# Patient Record
Sex: Male | Born: 1995 | Race: White | Hispanic: No | Marital: Single | Smoking: Never smoker
Health system: Southern US, Community
[De-identification: ages and names within clinical notes are randomized; demographics above are authoritative.]

## PROBLEM LIST (undated history)

## (undated) DIAGNOSIS — F419 Anxiety disorder, unspecified: Secondary | ICD-10-CM

## (undated) HISTORY — PX: WISDOM TOOTH EXTRACTION: SHX21

## (undated) HISTORY — PX: HERNIA REPAIR: SHX51

---
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
## 2017-05-11 ENCOUNTER — Ambulatory Visit (INDEPENDENT_AMBULATORY_CARE_PROVIDER_SITE_OTHER): Payer: Self-pay

## 2017-05-11 ENCOUNTER — Other Ambulatory Visit: Payer: Self-pay | Admitting: Emergency Medicine

## 2017-05-11 DIAGNOSIS — Z021 Encounter for pre-employment examination: Secondary | ICD-10-CM

## 2018-05-21 ENCOUNTER — Other Ambulatory Visit: Payer: Self-pay | Admitting: Gerontology

## 2018-05-21 ENCOUNTER — Ambulatory Visit (INDEPENDENT_AMBULATORY_CARE_PROVIDER_SITE_OTHER): Payer: Self-pay

## 2018-05-21 DIAGNOSIS — Z0000 Encounter for general adult medical examination without abnormal findings: Secondary | ICD-10-CM

## 2019-08-26 IMAGING — DX DG CHEST 1V
1 series · 1 of 1 positions shown · non-contrast
Comparison: Radiograph May 11, 2017.

CLINICAL DATA: Annual physical exam.

EXAM:
CHEST  1 VIEW

[chest pa]
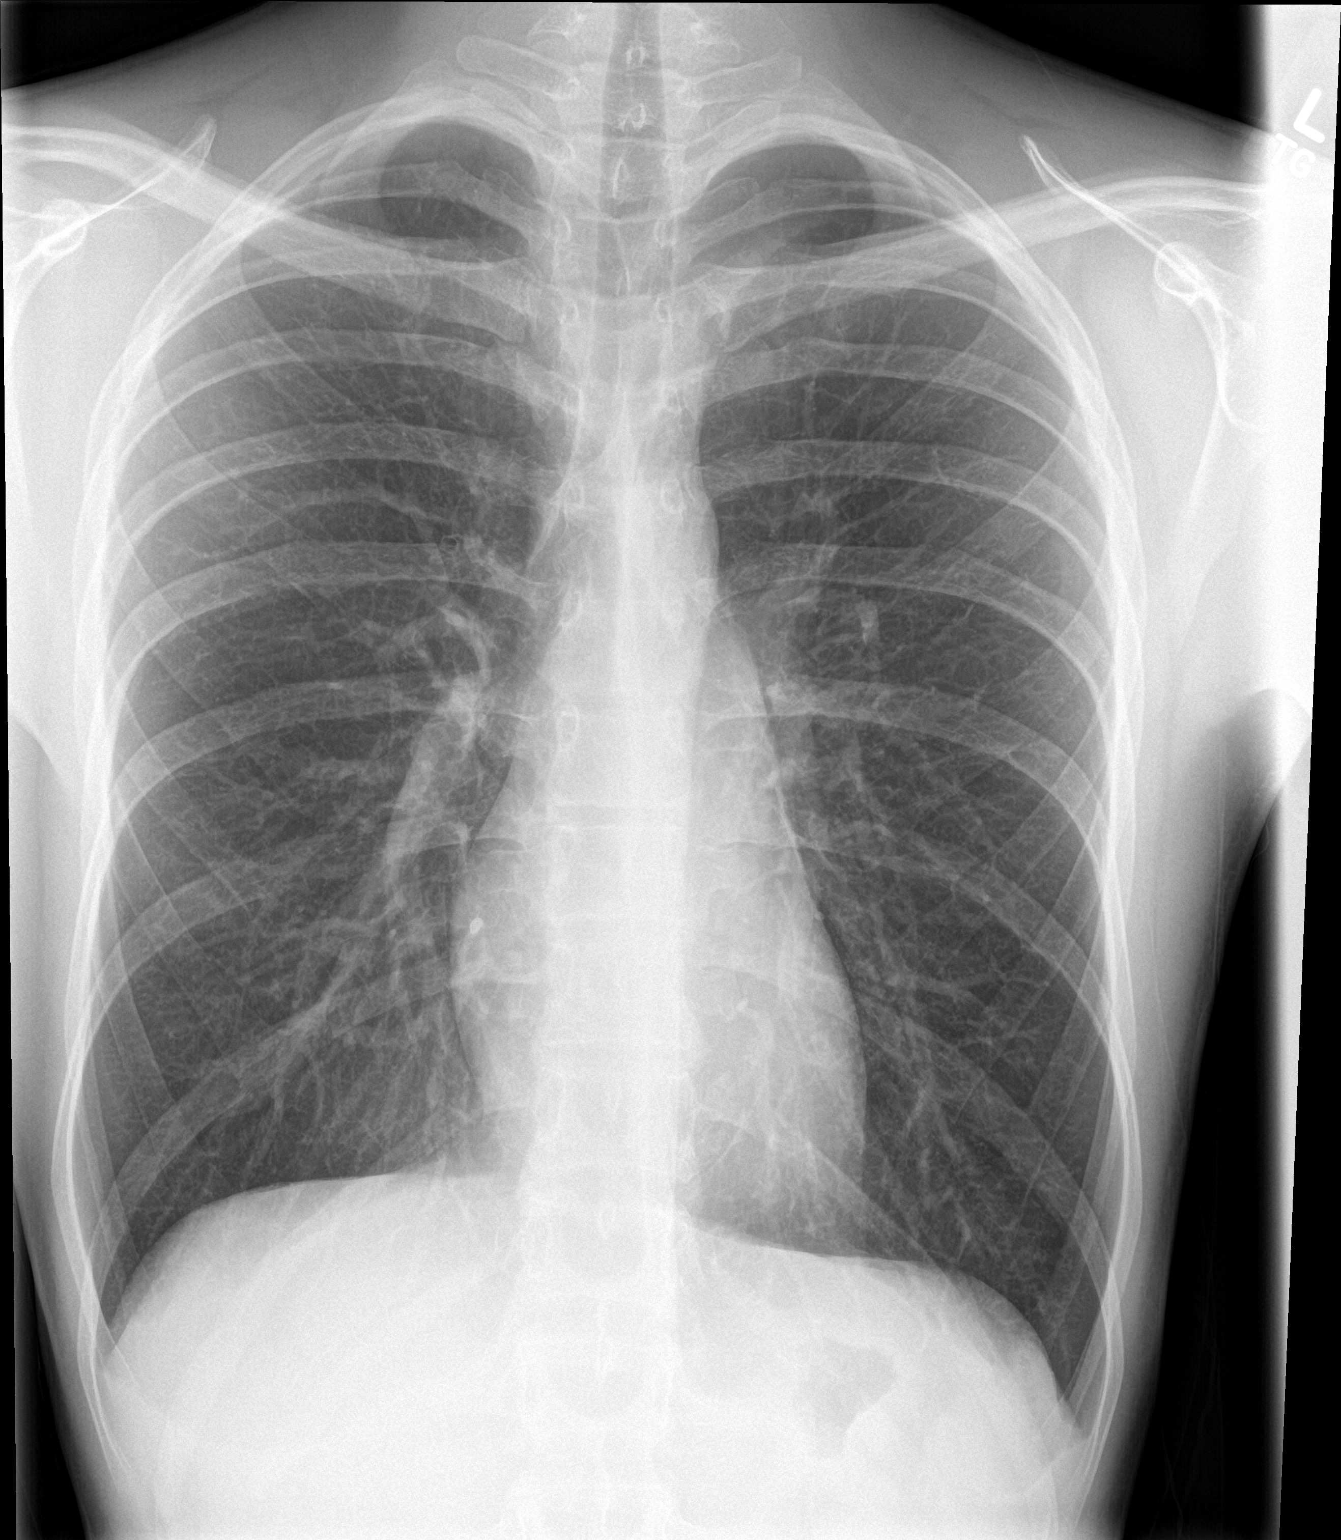

[1 of 1 positions shown; findings below may reference images not displayed]

FINDINGS: The heart size and mediastinal contours are within normal limits.
Both lungs are clear. The visualized skeletal structures are
unremarkable.
IMPRESSION: No active disease.

## 2021-12-14 ENCOUNTER — Other Ambulatory Visit: Payer: Self-pay

## 2021-12-14 ENCOUNTER — Ambulatory Visit
Admission: RE | Admit: 2021-12-14 | Discharge: 2021-12-14 | Disposition: A | Discharge status: Home / Self Care | Payer: BC Managed Care – PPO | Source: Ambulatory Visit | Attending: Emergency Medicine | Admitting: Emergency Medicine

## 2021-12-14 VITALS — BP 125/77 | HR 76 | Temp 97.9°F | Resp 14

## 2021-12-14 DIAGNOSIS — R110 Nausea: Secondary | ICD-10-CM

## 2021-12-14 MED ORDER — AMITRIPTYLINE HCL 10 MG PO TABS: 10.0000 mg | ORAL_TABLET | Freq: Every day | ORAL | 2 refills | Status: AC

## 2021-12-14 NOTE — Discharge Instructions (Addendum)
I have initiated request to assist you in finding a primary care provider.  I have also provided you with contact information for Gastroenterology in [place].  Please reach out to them today to see if you are able to schedule an appointment.  If you are not, please wait to see your new primary care provider and request a referral at that time. ? ?While you are waiting to get these appointments set up, I recommend that you try a medication called amitriptyline 10 mg, 1 tablet at bedtime.  This medication is in a class of medications called tricyclic antidepressants.  This particular version has been known to address dyspepsia, pain, anxiety and headache.  I think it is worth a try for a week or 2.  After week, if you notice that your symptoms have improved slightly but not quite as much as you like, you can certainly double or triple this medication until you find a dose that works well for you. ? ?Thank you for visiting urgent care today. ?

## 2021-12-14 NOTE — ED Triage Notes (Signed)
Pt reports that had issues with nausea all his life. Reports had issues with nausea espt after eating and drinking. Reports that stools have been very sticky and having to strain more to have BM.  Been taking zofran but not helping with nausea.  ?Doesn't have a PCP and wanting a referral to GI.  ?

## 2021-12-14 NOTE — ED Provider Notes (Signed)
[facility]    CSN: [id number] Arrival date & time: 12/14/21  1257    HISTORY   Chief Complaint  Patient presents with   appt 1p   Nausea   HPI Donald Pierce is a 26 y.o. male. Pt reports that had issues with nausea all his life. Reports had issues with nausea espt after eating and drinking. Reports that stools have been very sticky and having to strain more to have BM.  Been taking zofran but not helping with nausea.  Patient states that he does not have a PCP and is asking for a referral to GI.  Patient denies history of GERD, constipation.  Patient states that occasionally does have diarrhea.  Patient is here with his girlfriend today who states that patient sometimes gets "worked up" about being nauseated, almost as if he is anticipating it all the time.  The history is provided by the patient.  History reviewed. No pertinent past medical history. There are no problems to display for this patient.  Past Surgical History:  Procedure Laterality Date   HERNIA REPAIR     WISDOM TOOTH EXTRACTION      Home Medications    Prior to Admission medications   Medication Sig Start Date End Date Taking? Authorizing Provider  Cetirizine HCl (ZYRTEC ALLERGY) 10 MG CAPS 1 tab PO daily 06/27/15 08/10/15  Glynis Smiles, DO  diphenhydrAMINE (SOMINEX) 25 MG tablet Take 25 mg by mouth at bedtime as needed (symptoms).    [provider]  ibuprofen (ADVIL,MOTRIN) 200 MG tablet Take 200 mg by mouth every 6 (six) hours as needed for fever.    [provider]   Family History No family history on file. Social History Social History   Tobacco Use   Smoking status: Never   Smokeless tobacco: Never  Vaping Use   Vaping Use: Never used  Substance Use Topics   Alcohol use: Not Currently   Allergies   Cephalosporins and Penicillins  Review of Systems Review of Systems Pertinent findings noted in history of present illness.   Physical Exam Triage Vital Signs ED  Triage Vitals  Enc Vitals Group     BP 08/06/21 0827 (!) 147/82     Pulse Rate 08/06/21 0827 72     Resp 08/06/21 0827 18     Temp 08/06/21 0827 98.3 F (36.8 C)     Temp Source 08/06/21 0827 Oral     SpO2 08/06/21 0827 98 %     Weight --      Height --      Head Circumference --      Peak Flow --      Pain Score 08/06/21 0826 5     Pain Loc --      Pain Edu? --      Excl. in [place]? --   No data found.  Updated Vital Signs BP 125/77 (BP Location: Left Arm)    Pulse 76    Temp 97.9 F (36.6 C) (Oral)    Resp 14    SpO2 97%   Physical Exam Vitals and nursing note reviewed.  Constitutional:      General: He is not in acute distress.    Appearance: Normal appearance. He is not ill-appearing.  HENT:     Head: Normocephalic and atraumatic.  Eyes:     General: Lids are normal.        Right eye: No discharge.        Left eye: No discharge.  Extraocular Movements: Extraocular movements intact.     Conjunctiva/sclera: Conjunctivae normal.     Right eye: Right conjunctiva is not injected.     Left eye: Left conjunctiva is not injected.  Neck:     Trachea: Trachea and phonation normal.  Cardiovascular:     Rate and Rhythm: Normal rate and regular rhythm.     Pulses: Normal pulses.     Heart sounds: Normal heart sounds. No murmur heard.   No friction rub. No gallop.  Pulmonary:     Effort: Pulmonary effort is normal. No accessory muscle usage, prolonged expiration or respiratory distress.     Breath sounds: Normal breath sounds. No stridor, decreased air movement or transmitted upper airway sounds. No decreased breath sounds, wheezing, rhonchi or rales.  Chest:     Chest wall: No tenderness.  Abdominal:     General: Abdomen is flat. Bowel sounds are normal.     Palpations: Abdomen is soft.  Musculoskeletal:        General: Normal range of motion.     Cervical back: Normal range of motion and neck supple. Normal range of motion.  Lymphadenopathy:     Cervical: No cervical  adenopathy.  Skin:    General: Skin is warm and dry.     Findings: No erythema or rash.  Neurological:     General: No focal deficit present.     Mental Status: He is alert and oriented to person, place, and time.  Psychiatric:        Mood and Affect: Mood normal.        Behavior: Behavior normal.    Visual Acuity Right Eye Distance:   Left Eye Distance:   Bilateral Distance:    Right Eye Near:   Left Eye Near:    Bilateral Near:     UC Couse / Diagnostics / Procedures:    EKG  Radiology No results found.  Procedures Procedures (including critical care time)  UC Diagnoses / Final Clinical Impressions(s)   I have reviewed the triage vital signs and the nursing notes.  Pertinent labs & imaging results that were available during my care of the patient were reviewed by me and considered in my medical decision making (see chart for details).   Final diagnoses:  Daily nausea   Suspect there is a slight psychomotor component to patient's ongoing nausea but do agree that patient does need GI work-up.  I advised patient that I recommend he try Elavil while he is waiting for GI appointment.  Patient agreeable.  All questions addressed, return precautions advised.  ED Prescriptions     Medication Sig Dispense Auth. Provider   amitriptyline (ELAVIL) 10 MG tablet Take 1 tablet (10 mg total) by mouth at bedtime. 30 tablet Lynden Oxford Scales, PA-C      PDMP not reviewed this encounter.  Pending results:  Labs Reviewed - No data to display  Medications Ordered in UC: Medications - No data to display  Disposition Upon Discharge:  Condition: stable for discharge home Home: take medications as prescribed; routine discharge instructions as discussed; follow up as advised.  Patient presented with an acute illness with associated systemic symptoms and significant discomfort requiring urgent management. In my opinion, this is a condition that a prudent lay person (someone who  possesses an average knowledge of health and medicine) may potentially expect to result in complications if not addressed urgently such as respiratory distress, impairment of bodily function or dysfunction of bodily organs.   Routine  symptom specific, illness specific and/or disease specific instructions were discussed with the patient and/or caregiver at length.   As such, the patient has been evaluated and assessed, work-up was performed and treatment was provided in alignment with urgent care protocols and evidence based medicine.  Patient/parent/caregiver has been advised that the patient may require follow up for further testing and treatment if the symptoms continue in spite of treatment, as clinically indicated and appropriate.  If the patient was tested for COVID-19, Influenza and/or RSV, then the patient/parent/guardian was advised to isolate at home pending the results of his/her diagnostic coronavirus test and potentially longer if theyre positive. I have also advised pt that if his/her COVID-19 test returns positive, it's recommended to self-isolate for at least 10 days after symptoms first appeared AND until fever-free for 24 hours without fever reducer AND other symptoms have improved or resolved. Discussed self-isolation recommendations as well as instructions for household member/close contacts as per the [facility] and [place] [facility], and also gave patient the [facility] packet with this information.  Patient/parent/caregiver has been advised to return to the [facility] or PCP in 3-5 days if no better; to PCP or the Emergency Department if new signs and symptoms develop, or if the current signs or symptoms continue to change or worsen for further workup, evaluation and treatment as clinically indicated and appropriate  The patient will follow up with their current PCP if and as advised. If the patient does not currently have a PCP we will assist them in obtaining one.   The patient may need specialty follow up  if the symptoms continue, in spite of conservative treatment and management, for further workup, evaluation, consultation and treatment as clinically indicated and appropriate.  Patient/parent/caregiver verbalized understanding and agreement of plan as discussed.  All questions were addressed during visit.  Please see discharge instructions below for further details of plan.  Discharge Instructions:   Discharge Instructions      I have initiated request to assist you in finding a primary care provider.  I have also provided you with contact information for Gastroenterology in [place].  Please reach out to them today to see if you are able to schedule an appointment.  If you are not, please wait to see your new primary care provider and request a referral at that time.  While you are waiting to get these appointments set up, I recommend that you try a medication called amitriptyline 10 mg, 1 tablet at bedtime.  This medication is in a class of medications called tricyclic antidepressants.  This particular version has been known to address dyspepsia, pain, anxiety and headache.  I think it is worth a try for a week or 2.  After week, if you notice that your symptoms have improved slightly but not quite as much as you like, you can certainly double or triple this medication until you find a dose that works well for you.  Thank you for visiting urgent care today.    This office note has been dictated using Museum/gallery curator.  Unfortunately, and despite my best efforts, this method of dictation can sometimes lead to occasional typographical or grammatical errors.  I apologize in advance if this occurs.     Lynden Oxford Scales, PA-C 12/14/21 1539

## 2021-12-19 ENCOUNTER — Encounter (HOSPITAL_BASED_OUTPATIENT_CLINIC_OR_DEPARTMENT_OTHER): Payer: Self-pay | Admitting: Emergency Medicine

## 2021-12-19 ENCOUNTER — Emergency Department (HOSPITAL_BASED_OUTPATIENT_CLINIC_OR_DEPARTMENT_OTHER): Payer: BC Managed Care – PPO

## 2021-12-19 ENCOUNTER — Emergency Department (HOSPITAL_BASED_OUTPATIENT_CLINIC_OR_DEPARTMENT_OTHER)
Admission: EM | Admit: 2021-12-19 | Discharge: 2021-12-19 | Disposition: A | Discharge status: Home / Self Care | Payer: BC Managed Care – PPO | Attending: Emergency Medicine | Admitting: Emergency Medicine

## 2021-12-19 ENCOUNTER — Other Ambulatory Visit: Payer: Self-pay

## 2021-12-19 DIAGNOSIS — R110 Nausea: Secondary | ICD-10-CM | POA: Insufficient documentation

## 2021-12-19 DIAGNOSIS — R0789 Other chest pain: Secondary | ICD-10-CM | POA: Insufficient documentation

## 2021-12-19 DIAGNOSIS — F419 Anxiety disorder, unspecified: Secondary | ICD-10-CM | POA: Insufficient documentation

## 2021-12-19 DIAGNOSIS — R079 Chest pain, unspecified: Secondary | ICD-10-CM | POA: Diagnosis present

## 2021-12-19 HISTORY — DX: Anxiety disorder, unspecified: F41.9

## 2021-12-19 LAB — COMPREHENSIVE METABOLIC PANEL
ALT: 10 U/L (ref 0–44)
AST: 16 U/L (ref 15–41)
Albumin: 4.9 g/dL (ref 3.5–5.0)
Alkaline Phosphatase: 64 U/L (ref 38–126)
Anion gap: 12 (ref 5–15)
BUN: 16 mg/dL (ref 6–20)
CO2: 24 mmol/L (ref 22–32)
Calcium: 9.9 mg/dL (ref 8.9–10.3)
Chloride: 102 mmol/L (ref 98–111)
Creatinine, Ser: 1.1 mg/dL (ref 0.61–1.24)
GFR, Estimated: >60 mL/min (ref >60)
Glucose, Bld: 95 mg/dL (ref 70–99)
Potassium: 4.2 mmol/L (ref 3.5–5.1)
Sodium: 138 mmol/L (ref 135–145)
Total Bilirubin: 1.2 mg/dL (ref 0.3–1.2)
Total Protein: 7.9 g/dL (ref 6.5–8.1)

## 2021-12-19 LAB — CBC WITH DIFFERENTIAL/PLATELET
Abs Immature Granulocytes: 0 10*3/uL (ref 0.00–0.07)
Basophils Absolute: 0 10*3/uL (ref 0.0–0.1)
Basophils Relative: 1 %
Eosinophils Absolute: 0.1 10*3/uL (ref 0.0–0.5)
Eosinophils Relative: 2 %
HCT: 48.2 % (ref 39.0–52.0)
Hemoglobin: 16.9 g/dL (ref 13.0–17.0)
Immature Granulocytes: 0 %
Lymphocytes Relative: 34 %
Lymphs Abs: 1.5 10*3/uL (ref 0.7–4.0)
MCH: 30.6 pg (ref 26.0–34.0)
MCHC: 35.1 g/dL (ref 30.0–36.0)
MCV: 87.2 fL (ref 80.0–100.0)
Monocytes Absolute: 0.4 10*3/uL (ref 0.1–1.0)
Monocytes Relative: 8 %
Neutro Abs: 2.4 10*3/uL (ref 1.7–7.7)
Neutrophils Relative %: 55 %
Platelets: 237 10*3/uL (ref 150–400)
RBC: 5.53 MIL/uL (ref 4.22–5.81)
RDW: 11.6 % (ref 11.5–15.5)
WBC: 4.5 10*3/uL (ref 4.0–10.5)
nRBC: 0 % (ref 0.0–0.2)

## 2021-12-19 LAB — TROPONIN I (HIGH SENSITIVITY): Troponin I (High Sensitivity): <2 ng/L (ref <18)

## 2021-12-19 LAB — LIPASE, BLOOD: Lipase: 12 U/L (ref 11–51)

## 2021-12-19 LAB — D-DIMER, QUANTITATIVE: D-Dimer, Quant: <0.27 ug/mL-FEU (ref 0.00–0.50)

## 2021-12-19 MED ORDER — ONDANSETRON HCL 4 MG/2ML IJ SOLN
4.0000 mg | Freq: Once | INTRAMUSCULAR | Status: AC
  Administered 2021-12-19: 4 mg via INTRAVENOUS
  Filled 2021-12-19: qty 2

## 2021-12-19 NOTE — ED Provider Notes (Signed)
?[facility] ?Provider Note ? ? ?CSN: [id number] ?Arrival date & time: 12/19/21  1039 ? ?  ? ?History ? ?Chief Complaint  ?Patient presents with  ? Nausea  ? ? ?Donald Pierce is a 26 y.o. male. ? ?Is here with ongoing nausea.  Some chest pain yesterday.  Has started amitriptyline for nausea disorder/anxiety.  Denies any suicidal homicidal ideation.  Denies any cough or sputum production.  Denies any abdominal pain.  Started having some chest pain with his nausea symptoms.  Denies any recent illness.  Denies any alcohol, drug use including marijuana.  No significant family history of cardiac disease or pulmonary embolism.  States he does have anxiety often associated with his nausea. ? ?The history is provided by the patient.  ?Illness ?Severity:  Mild ?Onset quality:  Gradual ?Duration:  2 days ?Timing:  Intermittent ?Progression:  Waxing and waning ?Chronicity:  New ?Relieved by:  Nothing ?Worsened by:  Nothing ?Associated symptoms: chest pain and nausea   ?Associated symptoms: no abdominal pain, no congestion, no cough, no diarrhea, no ear pain, no fatigue, no fever, no headaches, no loss of consciousness, no myalgias, no rash, no rhinorrhea, no shortness of breath, no sore throat, no vomiting and no wheezing   ? ?  ? ?Home Medications ?Prior to Admission medications   ?Medication Sig Start Date End Date Taking? Authorizing Provider  ?amitriptyline (ELAVIL) 10 MG tablet Take 1 tablet (10 mg total) by mouth at bedtime. 12/14/21 03/14/22  Theadora Rama Scales, PA-C  ?ibuprofen (ADVIL,MOTRIN) 200 MG tablet Take 200 mg by mouth every 6 (six) hours as needed for fever.    [provider]  ?   ? ?Allergies    ?Cephalosporins and Penicillins   ? ?Review of Systems   ?Review of Systems  ?Constitutional:  Negative for fatigue and fever.  ?HENT:  Negative for congestion, ear pain, rhinorrhea and sore throat.   ?Respiratory:  Negative for cough, shortness of breath and wheezing.    ?Cardiovascular:  Positive for chest pain.  ?Gastrointestinal:  Positive for nausea. Negative for abdominal pain, diarrhea and vomiting.  ?Musculoskeletal:  Negative for myalgias.  ?Skin:  Negative for rash.  ?Neurological:  Negative for loss of consciousness and headaches.  ? ?Physical Exam ?Updated Vital Signs ?BP 120/77   Pulse 86   Temp 98 ?F (36.7 ?C) (Oral)   Resp 11   Ht 5\' 11"  (1.803 m)   Wt 68 kg   SpO2 100%   BMI 20.92 kg/m?  ?Physical Exam ?Vitals and nursing note reviewed.  ?Constitutional:   ?   General: He is not in acute distress. ?   Appearance: He is well-developed. He is not ill-appearing.  ?HENT:  ?   Head: Normocephalic and atraumatic.  ?   Nose: Nose normal.  ?   Mouth/Throat:  ?   Mouth: Mucous membranes are moist.  ?Eyes:  ?   Extraocular Movements: Extraocular movements intact.  ?   Conjunctiva/sclera: Conjunctivae normal.  ?   Pupils: Pupils are equal, round, and reactive to light.  ?Cardiovascular:  ?   Rate and Rhythm: Normal rate and regular rhythm.  ?   Pulses: Normal pulses.  ?   Heart sounds: Normal heart sounds. No murmur heard. ?Pulmonary:  ?   Effort: Pulmonary effort is normal. No respiratory distress.  ?   Breath sounds: Normal breath sounds.  ?Abdominal:  ?   Palpations: Abdomen is soft.  ?   Tenderness: There is no abdominal tenderness.  ?  Musculoskeletal:     ?   General: No swelling or tenderness. Normal range of motion.  ?   Cervical back: Normal range of motion and neck supple.  ?Skin: ?   General: Skin is warm and dry.  ?   Capillary Refill: Capillary refill takes less than 2 seconds.  ?Neurological:  ?   General: No focal deficit present.  ?   Mental Status: He is alert and oriented to person, place, and time.  ?   Cranial Nerves: No cranial nerve deficit.  ?   Sensory: No sensory deficit.  ?   Motor: No weakness.  ?   Coordination: Coordination normal.  ?   Comments: 5+ out of 5 strength throughout, normal sensation, no drift, normal finger-nose-finger   ?Psychiatric:     ?   Mood and Affect: Mood normal.  ? ? ?ED Results / Procedures / Treatments   ?Labs ?(all labs ordered are listed, but only abnormal results are displayed) ?Labs Reviewed  ?CBC WITH DIFFERENTIAL/PLATELET  ?COMPREHENSIVE METABOLIC PANEL  ?LIPASE, BLOOD  ?D-DIMER, QUANTITATIVE  ?TROPONIN I (HIGH SENSITIVITY)  ? ? ?EKG ?EKG Interpretation ? ?Date/Time:  Sunday December 19 2021 10:47:46 EDT ?Ventricular Rate:  64 ?PR Interval:  135 ?QRS Duration: 88 ?QT Interval:  385 ?QTC Calculation: 398 ?R Axis:   97 ?Text Interpretation: Ectopic atrial rhythm Confirmed by Virgina Norfolkuratolo, Zethan Alfieri ([id number]) on 12/19/2021 10:55:03 AM ? ?Radiology ?DG Chest Portable 1 View ? ?Result Date: 12/19/2021 ?CLINICAL DATA:  Chest pain.  Nausea for 1 week. EXAM: PORTABLE CHEST 1 VIEW COMPARISON:  05/21/2018 FINDINGS: Numerous leads and wires project over the chest. Midline trachea. Normal heart size. No pleural effusion or pneumothorax. Mild hyperinflation. Clear lungs. IMPRESSION: No active disease. Electronically Signed   By: Jeronimo GreavesKyle  Talbot M.D.   On: 12/19/2021 11:12   ? ?Procedures ?Procedures  ? ? ?Medications Ordered in ED ?Medications  ?ondansetron (ZOFRAN) injection 4 mg (4 mg Intravenous Given 12/19/21 1111)  ? ? ?ED Course/ Medical Decision Making/ A&P ?  ?                        ?Medical Decision Making ?Amount and/or Complexity of Data Reviewed ?Labs: ordered. ?Radiology: ordered. ? ?Risk ?Prescription drug management. ? ? ?Donald Pierce is here with chest pain, ongoing nausea.  Normal vitals.  No fever.  Chest pain for the last 2 days.  Denies any shortness of breath, cough, sputum production.  Continues to have anxiety feelings despite starting amitriptyline.  Having nausea at times as well.  Denies any recent illness.  No cardiac or PE risk factors.  Denies any drug use including marijuana.  No abdominal pain.  To evaluate we will get CBC, CMP, lipase, troponin, D-dimer, chest x-ray.  EKG shows sinus rhythm.  Some nonspecific ST  changes but appear to be repolarization pattern.  Does not appear to be consistent with ischemic changes.  Heart score is 1.  Differential diagnosis includes GI related issue, pneumonia, less likely acute coronary syndrome, pulmonary embolism, pericarditis or myocarditis.  Possible symptoms could be anxiety related. ? ?Per review of chest x-ray my interpretation is that there is no pneumonia or pneumothorax.  I have reviewed and interpreted labs that show no significant anemia, electrolyte abnormality, kidney injury.  D-dimer is normal.  Troponin is normal.  Gallbladder and liver enzymes and lipase all normal.  No concern for ACS, PE, hepatobiliary process.  Overall suspect symptoms are anxiety related.  We will have  him follow-up with behavioral health.  He would like referral to GI and will provide.  Discharged in good condition. ? ?This chart was dictated using voice recognition software.  Despite best efforts to proofread,  errors can occur which can change the documentation meaning.  ? ? ? ? ? ? ? ? ? ?Final Clinical Impression(s) / ED Diagnoses ?Final diagnoses:  ?Atypical chest pain  ?Anxiety  ?Nausea  ? ? ?Rx / DC Orders ?ED Discharge Orders   ? ? None  ? ?  ? ? ?  ?Virgina Norfolk, DO ?12/19/21 1154 ? ?

## 2021-12-19 NOTE — ED Triage Notes (Signed)
Pt report continues to have nausea, denies vomiting. Pt c/o center chest tightness that does not radiate. Pt seen at Urgent care recently. ?

## 2022-01-12 ENCOUNTER — Ambulatory Visit (HOSPITAL_BASED_OUTPATIENT_CLINIC_OR_DEPARTMENT_OTHER): Payer: BC Managed Care – PPO | Admitting: Nurse Practitioner
